# Patient Record
Sex: Male | Born: 1950 | Race: Black or African American | Hispanic: No | Marital: Single | Smoking: Never smoker
Health system: Southern US, Community
[De-identification: ages and names within clinical notes are randomized; demographics above are authoritative.]

## PROBLEM LIST (undated history)

## (undated) DIAGNOSIS — I10 Essential (primary) hypertension: Secondary | ICD-10-CM

## (undated) DIAGNOSIS — E119 Type 2 diabetes mellitus without complications: Secondary | ICD-10-CM

---

## 1999-07-18 ENCOUNTER — Encounter: Admission: RE | Admit: 1999-07-18 | Discharge: 1999-07-18 | Payer: Self-pay | Admitting: Cardiology

## 2000-07-31 ENCOUNTER — Encounter: Payer: Self-pay | Admitting: Emergency Medicine

## 2000-07-31 ENCOUNTER — Emergency Department (HOSPITAL_COMMUNITY): Admission: EM | Admit: 2000-07-31 | Discharge: 2000-07-31 | Payer: Self-pay | Admitting: *Deleted

## 2018-09-11 ENCOUNTER — Encounter (HOSPITAL_COMMUNITY): Payer: Self-pay

## 2018-09-11 ENCOUNTER — Emergency Department (HOSPITAL_COMMUNITY): Payer: No Typology Code available for payment source

## 2018-09-11 ENCOUNTER — Emergency Department (HOSPITAL_COMMUNITY)
Admission: EM | Admit: 2018-09-11 | Discharge: 2018-09-11 | Disposition: A | Discharge status: Home / Self Care | Payer: No Typology Code available for payment source | Attending: Emergency Medicine | Admitting: Emergency Medicine

## 2018-09-11 ENCOUNTER — Other Ambulatory Visit: Payer: Self-pay

## 2018-09-11 DIAGNOSIS — E119 Type 2 diabetes mellitus without complications: Secondary | ICD-10-CM | POA: Insufficient documentation

## 2018-09-11 DIAGNOSIS — M25551 Pain in right hip: Secondary | ICD-10-CM | POA: Insufficient documentation

## 2018-09-11 DIAGNOSIS — Y9389 Activity, other specified: Secondary | ICD-10-CM | POA: Insufficient documentation

## 2018-09-11 DIAGNOSIS — S0592XA Unspecified injury of left eye and orbit, initial encounter: Secondary | ICD-10-CM | POA: Diagnosis present

## 2018-09-11 DIAGNOSIS — I10 Essential (primary) hypertension: Secondary | ICD-10-CM | POA: Insufficient documentation

## 2018-09-11 DIAGNOSIS — Y92410 Unspecified street and highway as the place of occurrence of the external cause: Secondary | ICD-10-CM | POA: Diagnosis not present

## 2018-09-11 DIAGNOSIS — Y999 Unspecified external cause status: Secondary | ICD-10-CM | POA: Insufficient documentation

## 2018-09-11 DIAGNOSIS — M549 Dorsalgia, unspecified: Secondary | ICD-10-CM | POA: Diagnosis not present

## 2018-09-11 DIAGNOSIS — S0502XA Injury of conjunctiva and corneal abrasion without foreign body, left eye, initial encounter: Secondary | ICD-10-CM | POA: Insufficient documentation

## 2018-09-11 HISTORY — DX: Essential (primary) hypertension: I10

## 2018-09-11 HISTORY — DX: Type 2 diabetes mellitus without complications: E11.9

## 2018-09-11 MED ORDER — METHOCARBAMOL 500 MG PO TABS: 500.0000 mg | ORAL_TABLET | Freq: Two times a day (BID) | ORAL | 0 refills | Status: AC

## 2018-09-11 MED ORDER — ERYTHROMYCIN 5 MG/GM OP OINT
TOPICAL_OINTMENT | Freq: Four times a day (QID) | OPHTHALMIC | Status: DC
  Administered 2018-09-11: 1 via OPHTHALMIC
  Filled 2018-09-11: qty 3.5

## 2018-09-11 MED ORDER — METHOCARBAMOL 500 MG PO TABS
750.0000 mg | ORAL_TABLET | Freq: Once | ORAL | Status: AC
  Administered 2018-09-11: 750 mg via ORAL
  Filled 2018-09-11: qty 2
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
## 2018-09-11 NOTE — Discharge Instructions (Signed)
Use erythromycin eye ointment as prescribed.  Take Robaxin as needed for muscle spasms.  Take Tylenol or Motrin as needed for pain.  Ice affected areas.  Follow-up with the ophthalmologist in 2 days for continued evaluation of your corneal abrasion.  Follow-up with orthopedics in 1 to 2 weeks for continued evaluation of your back pain.  Return to the ED immediately for new or worsening symptoms, such as bowel or bladder incontinence, numbness, tingling, weakness or any concerns at all.

## 2018-09-11 NOTE — ED Notes (Signed)
Patient transported to X-ray 
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
